# Patient Record
Sex: Female | Born: 1980 | Race: Black or African American | Hispanic: No | Marital: Single | Smoking: Never smoker
Health system: Southern US, Community
[De-identification: ages and names within clinical notes are randomized; demographics above are authoritative.]

---

## 2017-06-05 ENCOUNTER — Emergency Department (HOSPITAL_COMMUNITY)
Admission: EM | Admit: 2017-06-05 | Discharge: 2017-06-05 | Disposition: A | Discharge status: Home / Self Care | Payer: Managed Care, Other (non HMO) | Attending: Physician Assistant | Admitting: Physician Assistant

## 2017-06-05 ENCOUNTER — Encounter (HOSPITAL_COMMUNITY): Payer: Self-pay | Admitting: Nurse Practitioner

## 2017-06-05 ENCOUNTER — Emergency Department (HOSPITAL_COMMUNITY): Payer: Managed Care, Other (non HMO)

## 2017-06-05 DIAGNOSIS — Z041 Encounter for examination and observation following transport accident: Secondary | ICD-10-CM | POA: Insufficient documentation

## 2017-06-05 DIAGNOSIS — R21 Rash and other nonspecific skin eruption: Secondary | ICD-10-CM | POA: Insufficient documentation

## 2017-06-05 DIAGNOSIS — M79642 Pain in left hand: Secondary | ICD-10-CM

## 2017-06-05 DIAGNOSIS — M25512 Pain in left shoulder: Secondary | ICD-10-CM

## 2017-06-05 DIAGNOSIS — M791 Myalgia: Secondary | ICD-10-CM | POA: Insufficient documentation

## 2017-06-05 MED ORDER — METHOCARBAMOL 500 MG PO TABS: 500.0000 mg | ORAL_TABLET | Freq: Two times a day (BID) | ORAL | 0 refills | Status: AC

## 2017-06-05 MED ORDER — IBUPROFEN 800 MG PO TABS: 800.0000 mg | ORAL_TABLET | Freq: Three times a day (TID) | ORAL | 0 refills | Status: AC

## 2017-06-05 NOTE — Discharge Instructions (Signed)
Please read attached information regarding your condition. Take ibuprofen and Robaxin as directed. Follow up with PCP for further evaluation if symptoms persist. Return to ED for worsening pain, numbness, weakness, additional injury, vision changes or trouble walking.
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
## 2017-06-05 NOTE — ED Notes (Signed)
Patient Alert and oriented X4. Stable and ambulatory. Patient verbalized understanding of the discharge instructions.  Patient belongings were taken by the patient.  
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
